# Patient Record
Sex: Male | Born: 1975 | Race: White | Hispanic: No | Marital: Single | Smoking: Never smoker
Health system: Southern US, Community
[De-identification: ages and names within clinical notes are randomized; demographics above are authoritative.]

---

## 2006-09-20 ENCOUNTER — Emergency Department: Payer: Self-pay | Admitting: Emergency Medicine
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
## 2009-03-26 ENCOUNTER — Ambulatory Visit: Payer: Self-pay | Admitting: Internal Medicine

## 2009-03-28 ENCOUNTER — Emergency Department: Payer: Self-pay | Admitting: Emergency Medicine

## 2009-04-23 ENCOUNTER — Encounter: Payer: Self-pay | Admitting: Internal Medicine

## 2009-05-19 ENCOUNTER — Encounter: Payer: Self-pay | Admitting: Internal Medicine

## 2009-06-18 ENCOUNTER — Encounter: Payer: Self-pay | Admitting: Internal Medicine

## 2013-05-24 ENCOUNTER — Emergency Department: Payer: Self-pay | Admitting: Emergency Medicine

## 2013-06-26 ENCOUNTER — Emergency Department: Payer: Self-pay | Admitting: Emergency Medicine

## 2013-10-11 ENCOUNTER — Emergency Department: Payer: Self-pay | Admitting: Emergency Medicine

## 2014-02-18 ENCOUNTER — Emergency Department: Payer: Self-pay | Admitting: Emergency Medicine

## 2014-03-02 ENCOUNTER — Emergency Department: Payer: Self-pay | Admitting: Emergency Medicine

## 2014-03-27 ENCOUNTER — Emergency Department: Payer: Self-pay | Admitting: Emergency Medicine

## 2014-04-01 ENCOUNTER — Emergency Department: Payer: Self-pay | Admitting: Emergency Medicine

## 2014-04-04 ENCOUNTER — Emergency Department: Payer: Self-pay | Admitting: Emergency Medicine

## 2014-07-03 ENCOUNTER — Emergency Department: Payer: Self-pay | Admitting: Emergency Medicine

## 2015-04-15 ENCOUNTER — Emergency Department: Admit: 2015-04-15 | Discharge status: Against Medical Advice | Payer: Self-pay | Admitting: Emergency Medicine

## 2018-06-02 ENCOUNTER — Emergency Department: Payer: Self-pay

## 2018-06-02 ENCOUNTER — Encounter: Payer: Self-pay | Admitting: Emergency Medicine

## 2018-06-02 ENCOUNTER — Emergency Department
Admission: EM | Admit: 2018-06-02 | Discharge: 2018-06-02 | Disposition: A | Discharge status: Home / Self Care | Payer: Self-pay | Attending: Emergency Medicine | Admitting: Emergency Medicine

## 2018-06-02 ENCOUNTER — Other Ambulatory Visit: Payer: Self-pay

## 2018-06-02 DIAGNOSIS — Y939 Activity, unspecified: Secondary | ICD-10-CM | POA: Insufficient documentation

## 2018-06-02 DIAGNOSIS — S40811A Abrasion of right upper arm, initial encounter: Secondary | ICD-10-CM | POA: Insufficient documentation

## 2018-06-02 DIAGNOSIS — Y929 Unspecified place or not applicable: Secondary | ICD-10-CM | POA: Insufficient documentation

## 2018-06-02 DIAGNOSIS — S299XXA Unspecified injury of thorax, initial encounter: Secondary | ICD-10-CM | POA: Insufficient documentation

## 2018-06-02 DIAGNOSIS — S20211A Contusion of right front wall of thorax, initial encounter: Secondary | ICD-10-CM | POA: Insufficient documentation

## 2018-06-02 DIAGNOSIS — S298XXA Other specified injuries of thorax, initial encounter: Secondary | ICD-10-CM

## 2018-06-02 DIAGNOSIS — Y999 Unspecified external cause status: Secondary | ICD-10-CM | POA: Insufficient documentation

## 2018-06-02 DIAGNOSIS — W01198A Fall on same level from slipping, tripping and stumbling with subsequent striking against other object, initial encounter: Secondary | ICD-10-CM | POA: Insufficient documentation

## 2018-06-02 MED ORDER — NAPROXEN 500 MG PO TABS
500.0000 mg | ORAL_TABLET | Freq: Once | ORAL | Status: AC
  Administered 2018-06-02: 500 mg via ORAL
  Filled 2018-06-02: qty 1

## 2018-06-02 MED ORDER — ONDANSETRON 4 MG PO TBDP
4.0000 mg | ORAL_TABLET | Freq: Once | ORAL | Status: AC
  Administered 2018-06-02: 4 mg via ORAL
  Filled 2018-06-02: qty 1

## 2018-06-02 MED ORDER — NAPROXEN 500 MG PO TABS: 500.0000 mg | ORAL_TABLET | Freq: Two times a day (BID) | ORAL | 0 refills | Status: AC

## 2018-06-02 MED ORDER — OXYCODONE HCL 5 MG PO TABS: 5.0000 mg | ORAL_TABLET | Freq: Three times a day (TID) | ORAL | 0 refills | Status: AC | PRN

## 2018-06-02 MED ORDER — FENTANYL CITRATE (PF) 100 MCG/2ML IJ SOLN
100.0000 ug | Freq: Once | INTRAMUSCULAR | Status: AC
  Administered 2018-06-02: 100 ug via INTRAMUSCULAR

## 2018-06-02 MED ORDER — CYCLOBENZAPRINE HCL 10 MG PO TABS: 10.0000 mg | ORAL_TABLET | Freq: Three times a day (TID) | ORAL | 0 refills | Status: AC | PRN

## 2018-06-02 MED ORDER — FENTANYL CITRATE (PF) 100 MCG/2ML IJ SOLN
100.0000 ug | Freq: Once | INTRAMUSCULAR | Status: DC
  Filled 2018-06-02: qty 2

## 2018-06-02 NOTE — ED Triage Notes (Signed)
States fell approx 8am and landed across [employer]. Pain across chest.

## 2018-06-02 NOTE — ED Provider Notes (Signed)
[facility] Emergency Department Provider Note ____________________________________________  Time seen: Approximately 3:17 PM  I have reviewed the triage vital signs and the nursing notes.   HISTORY  Chief Complaint Fall    HPI Wyatt Martin is a 42 y.o. male who presents to the emergency department for evaluation and treatment of chest wall pain after a mechanical, non-syncopal fall at about 8am. He fell over a flower bed and chest landed on landscape timbers. He has an abrasion on the right upper medial arm and across the chest wall. Pain is sharp when trying to take a deep breath. No shortness of breath.  History reviewed. No pertinent past medical history.  There are no active problems to display for this patient.   History reviewed. No pertinent surgical history.  Prior to Admission medications   Medication Sig Start Date End Date Taking? Authorizing Provider  cyclobenzaprine (FLEXERIL) 10 MG tablet Take 1 tablet (10 mg total) by mouth 3 (three) times daily as needed for muscle spasms. 06/02/18   Brooks Stotz, Rulon Eisenmenger B, FNP  naproxen (NAPROSYN) 500 MG tablet Take 1 tablet (500 mg total) by mouth 2 (two) times daily with a meal. 06/02/18   Sarkis Rhines B, FNP  oxyCODONE (ROXICODONE) 5 MG immediate release tablet Take 1 tablet (5 mg total) by mouth every 8 (eight) hours as needed. 06/02/18 06/02/19  Chinita Pester, FNP    Allergies Patient has no known allergies.  No family history on file.  Social History Social History   Tobacco Use  . Smoking status: Never Smoker  Substance Use Topics  . Alcohol use: Not on file  . Drug use: Not on file    Review of Systems Constitutional: Negative for fever. Cardiovascular: Negative for chest pain. Respiratory: Negative for shortness of breath. Musculoskeletal: Positive for right upper arm pain and chest wall pain. Skin: Positive for abrasion and contusion  Neurological: Negative for decrease in  sensation  ____________________________________________   PHYSICAL EXAM:  VITAL SIGNS: ED Triage Vitals  Enc Vitals Group     BP 06/02/18 1407 (!) 105/97     Pulse Rate 06/02/18 1407 (!) 101     Resp 06/02/18 1407 18     Temp 06/02/18 1407 98.3 F (36.8 C)     Temp Source 06/02/18 1407 Oral     SpO2 06/02/18 1407 99 %     Weight 06/02/18 1411 230 lb (104.3 kg)     Height 06/02/18 1411 5\' 9"  (1.753 m)     Head Circumference --      Peak Flow --      Pain Score 06/02/18 1410 10     Pain Loc --      Pain Edu? --      Excl. in GC? --     Constitutional: Alert and oriented. Well appearing and in no acute distress. Shallow breathing secondary to pain. Eyes: Conjunctivae are clear without discharge or drainage Head: Atraumatic Neck: Supple Respiratory: No cough. Respirations are even and unlabored. Breath sounds present and clear. Musculoskeletal: Full, active ROM of the right arm including shoulder. Chest wall pain with expansion of muscles during deep breath and movement. Gastrointestinal: Bowel sounds active and present x 4. No focal abdominal tenderness. No guarding or rebound Neurologic: Awake, alert, and oriented.  Skin: Abrasions over the upper/medial right arm. Diagonal, linear early ecchymosis over the chest wall consistent with described mechanism of injury. Psychiatric: Affect and behavior are appropriate.  ____________________________________________   LABS (all labs ordered  are listed, but only abnormal results are displayed)  Labs Reviewed - No data to display ____________________________________________  RADIOLOGY  Chest x-ray negative for acute bony abnormality per radiology. ____________________________________________   PROCEDURES  Procedures  ____________________________________________   INITIAL IMPRESSION / ASSESSMENT AND PLAN / ED COURSE  Wyatt Martin is a 42 y.o. who presents to the emergency department for treatment and evaluation  after a mechanical, nonsyncopal fall at 8am. X-ray does not reveal rib fractures. Home care including ice, rest, use of pillow to move and take deep breaths, medications, and frequent position changes was discussed. He is aware to return to the ER for any sudden changes such as shortness of breath or other concerns.  Medications  ondansetron (ZOFRAN-ODT) disintegrating tablet 4 mg (4 mg Oral Given 06/02/18 1521)  naproxen (NAPROSYN) tablet 500 mg (500 mg Oral Given 06/02/18 1520)  fentaNYL (SUBLIMAZE) injection 100 mcg (100 mcg Intramuscular Given 06/02/18 1520)    Pertinent labs & imaging results that were available during my care of the patient were reviewed by me and considered in my medical decision making (see chart for details).  _________________________________________   FINAL CLINICAL IMPRESSION(S) / ED DIAGNOSES  Final diagnoses:  Blunt injury of chest, initial encounter  Rib contusion, right, initial encounter  Abrasion of arm, right, initial encounter    ED Discharge Orders        Ordered    naproxen (NAPROSYN) 500 MG tablet  2 times daily with meals     06/02/18 1504    oxyCODONE (ROXICODONE) 5 MG immediate release tablet  Every 8 hours PRN     06/02/18 1504    cyclobenzaprine (FLEXERIL) 10 MG tablet  3 times daily PRN     06/02/18 1504       If controlled substance prescribed during this visit, 12 month history viewed on the [facility] prior to issuing an initial prescription for Schedule II or III opiod.    Chinita Pesterriplett, Yahsir Wickens B, FNP 06/02/18 1529    Sharman CheekStafford, Phillip, MD 06/02/18 1555

## 2018-06-02 NOTE — ED Triage Notes (Signed)
First Nurse Note:  Larey SeatFell today onto a flower bed, onto [employer] timbers.  C/O bruising to chest and pain with movement and breathing.

## 2019-05-23 IMAGING — CR DG CHEST 2V
1 series · 2 of 2 positions shown · non-contrast
Comparison: 03/27/2014

CLINICAL DATA: 41-year-old male with a history of fall

EXAM:
CHEST - 2 VIEW

[Series 1: dg chest 2 view · 0.14mm/px · 2 of 2 slices shown]
[im 1/2]
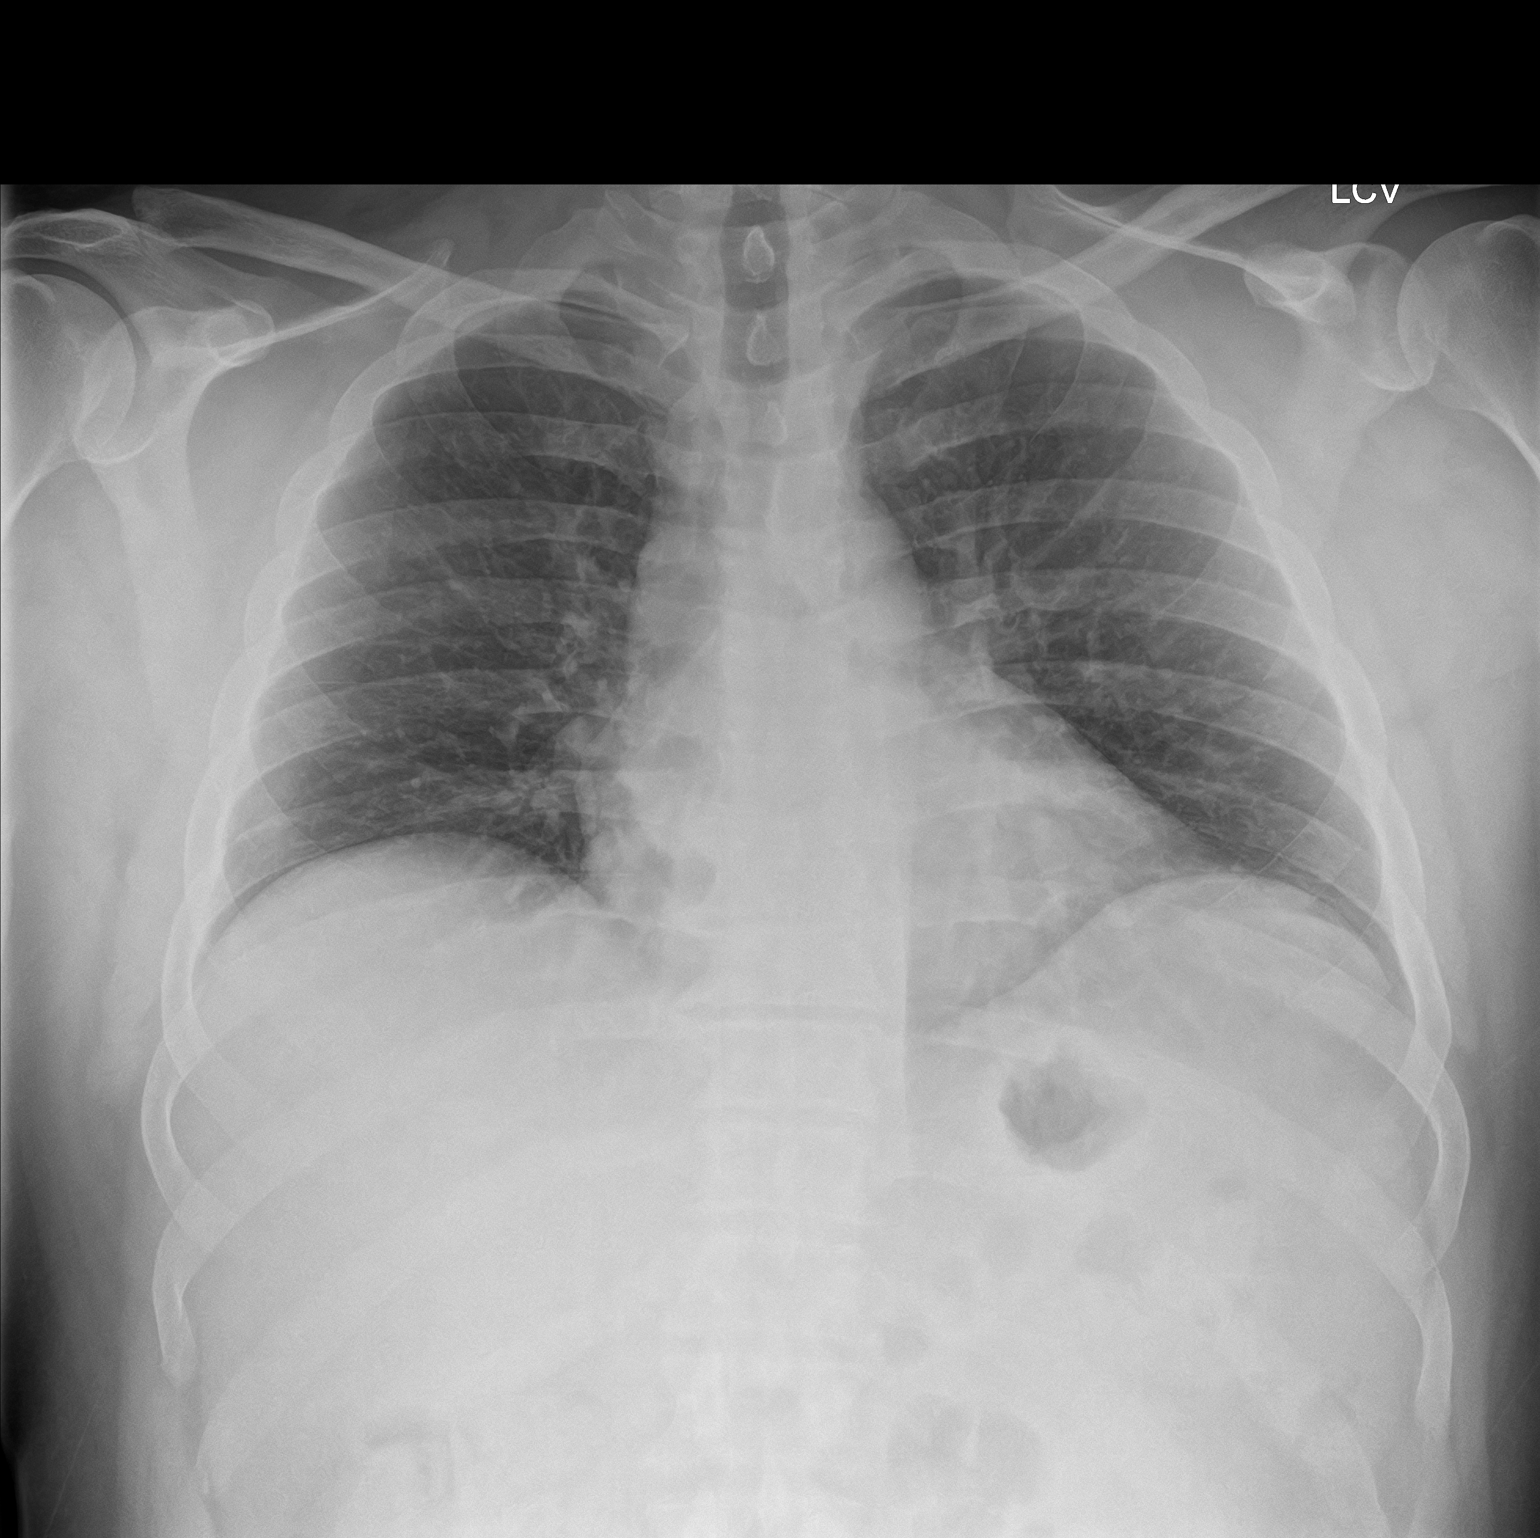
[im 2/2]
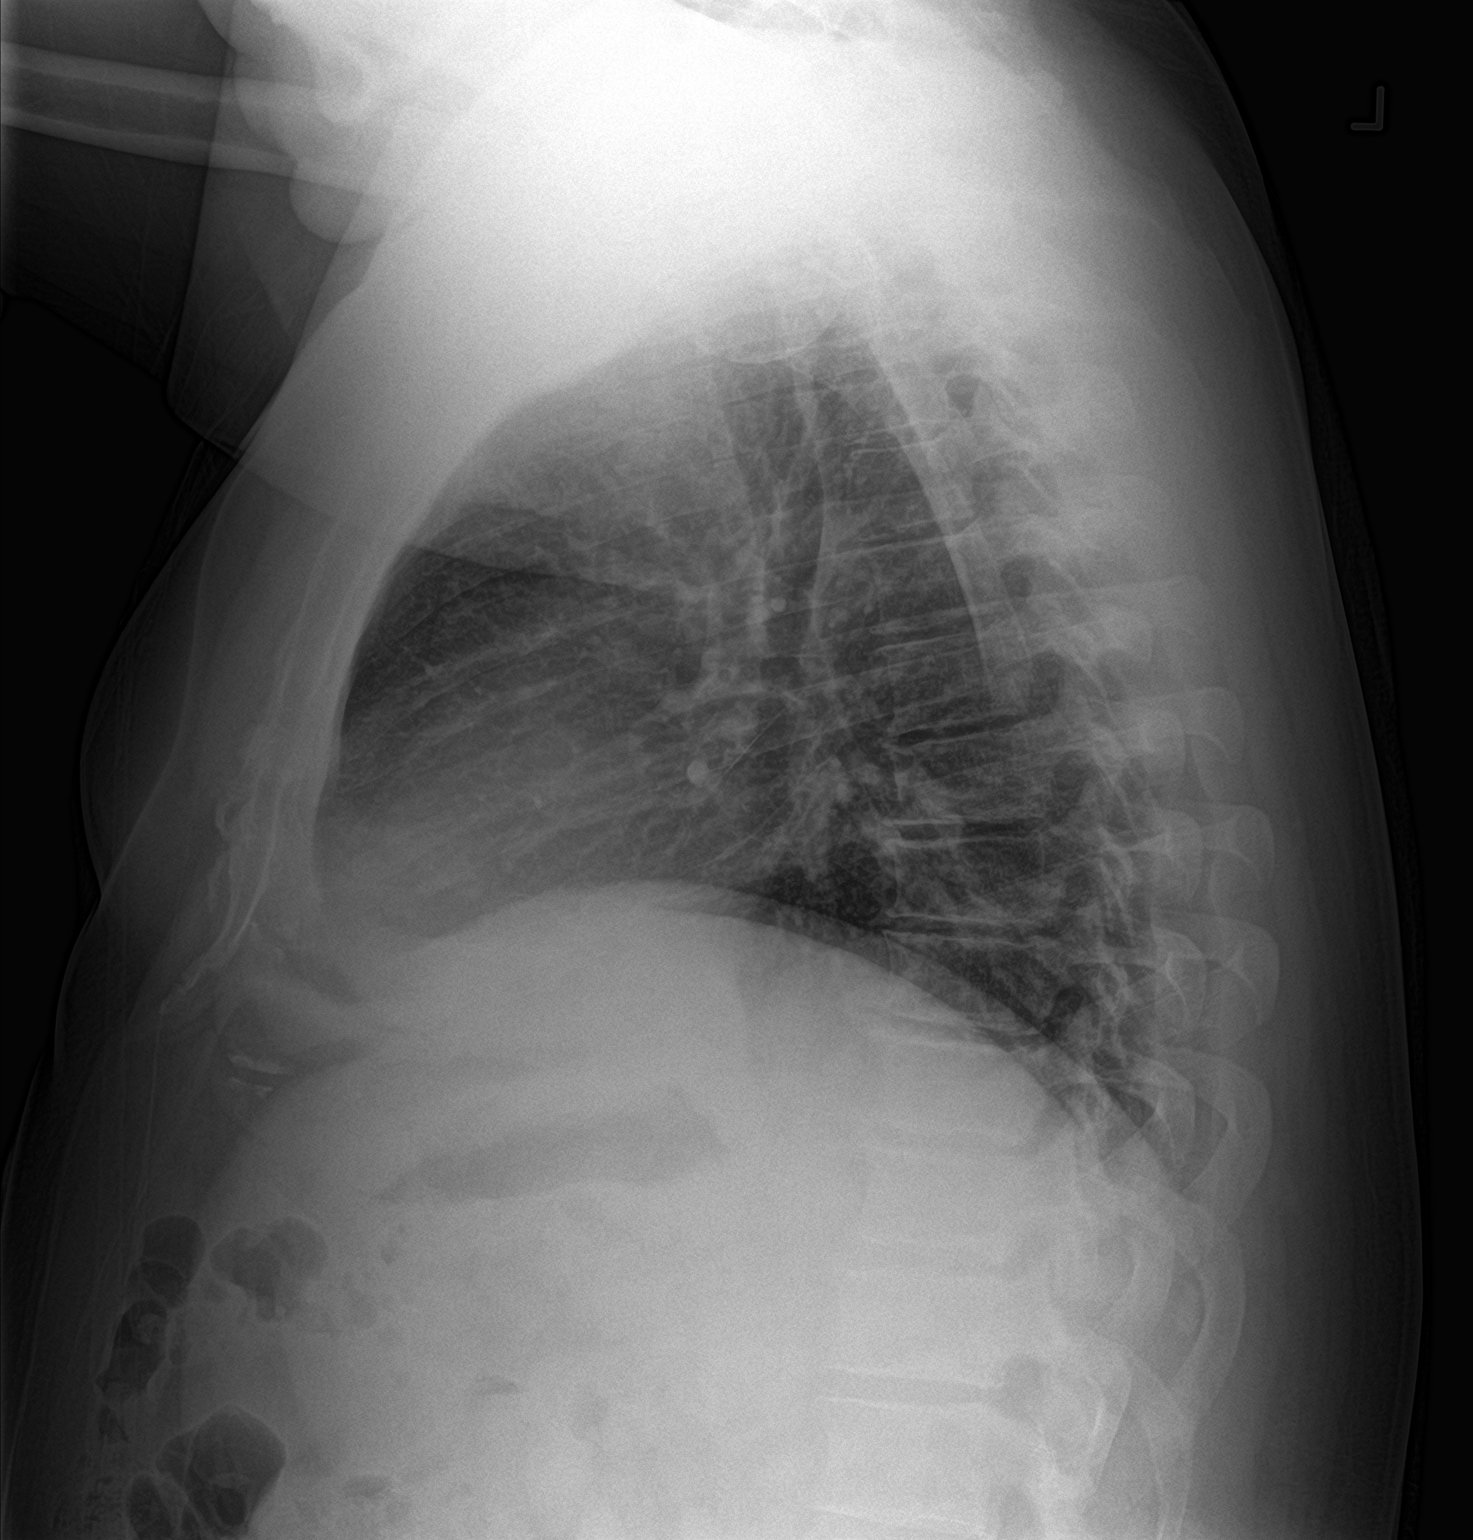

[2 of 2 positions shown; findings below may reference images not displayed]

FINDINGS: The heart size and mediastinal contours are within normal limits.
Both lungs are clear. The visualized skeletal structures are
unremarkable.
IMPRESSION: Negative for acute cardiopulmonary disease
